# Patient Record
Sex: Female | Born: 1995 | Race: White | Hispanic: No | Marital: Single | Smoking: Never smoker
Health system: Southern US, Community
[De-identification: ages and names within clinical notes are randomized; demographics above are authoritative.]

---
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
## 2016-05-06 DIAGNOSIS — R05 Cough: Secondary | ICD-10-CM | POA: Diagnosis not present

## 2016-05-06 DIAGNOSIS — R0989 Other specified symptoms and signs involving the circulatory and respiratory systems: Secondary | ICD-10-CM | POA: Diagnosis not present

## 2016-05-06 DIAGNOSIS — J0100 Acute maxillary sinusitis, unspecified: Secondary | ICD-10-CM | POA: Diagnosis not present

## 2016-05-06 DIAGNOSIS — J208 Acute bronchitis due to other specified organisms: Secondary | ICD-10-CM | POA: Diagnosis not present

## 2016-06-28 DIAGNOSIS — H6693 Otitis media, unspecified, bilateral: Secondary | ICD-10-CM | POA: Diagnosis not present

## 2016-06-28 DIAGNOSIS — Z0001 Encounter for general adult medical examination with abnormal findings: Secondary | ICD-10-CM | POA: Diagnosis not present

## 2016-06-28 DIAGNOSIS — Z23 Encounter for immunization: Secondary | ICD-10-CM | POA: Diagnosis not present

## 2016-06-28 DIAGNOSIS — Z68.41 Body mass index (BMI) pediatric, less than 5th percentile for age: Secondary | ICD-10-CM | POA: Diagnosis not present

## 2016-06-28 DIAGNOSIS — Z0000 Encounter for general adult medical examination without abnormal findings: Secondary | ICD-10-CM | POA: Diagnosis not present

## 2016-06-28 DIAGNOSIS — Z1389 Encounter for screening for other disorder: Secondary | ICD-10-CM | POA: Diagnosis not present

## 2016-08-12 DIAGNOSIS — N390 Urinary tract infection, site not specified: Secondary | ICD-10-CM | POA: Diagnosis not present

## 2016-08-12 DIAGNOSIS — H6503 Acute serous otitis media, bilateral: Secondary | ICD-10-CM | POA: Diagnosis not present

## 2016-12-26 DIAGNOSIS — Z6820 Body mass index (BMI) 20.0-20.9, adult: Secondary | ICD-10-CM | POA: Diagnosis not present

## 2016-12-26 DIAGNOSIS — Z113 Encounter for screening for infections with a predominantly sexual mode of transmission: Secondary | ICD-10-CM | POA: Diagnosis not present

## 2016-12-26 DIAGNOSIS — Z1389 Encounter for screening for other disorder: Secondary | ICD-10-CM | POA: Diagnosis not present

## 2016-12-26 DIAGNOSIS — N390 Urinary tract infection, site not specified: Secondary | ICD-10-CM | POA: Diagnosis not present

## 2017-06-28 ENCOUNTER — Encounter: Payer: Self-pay | Admitting: Orthopaedic Surgery

## 2017-06-28 ENCOUNTER — Ambulatory Visit (INDEPENDENT_AMBULATORY_CARE_PROVIDER_SITE_OTHER): Payer: BLUE CROSS/BLUE SHIELD | Admitting: Orthopaedic Surgery

## 2017-06-28 ENCOUNTER — Ambulatory Visit (INDEPENDENT_AMBULATORY_CARE_PROVIDER_SITE_OTHER): Payer: BLUE CROSS/BLUE SHIELD

## 2017-06-28 ENCOUNTER — Ambulatory Visit: Payer: BLUE CROSS/BLUE SHIELD

## 2017-06-28 VITALS — BP 113/75 | HR 80 | Temp 97.9°F | Ht 65.0 in | Wt 123.0 lb

## 2017-06-28 DIAGNOSIS — M25561 Pain in right knee: Principal | ICD-10-CM

## 2017-06-28 DIAGNOSIS — M25562 Pain in left knee: Secondary | ICD-10-CM | POA: Diagnosis not present

## 2017-06-28 DIAGNOSIS — G8929 Other chronic pain: Secondary | ICD-10-CM

## 2017-06-28 NOTE — Progress Notes (Signed)
Subjective:    Patient ID: Alicia Tran, female    DOB: 06/23/1996, 22 y.o.   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  She has pain on and off.  She has no trauma.  She has no giving way of the knees, no redness, no swelling.  They just hurt at times.  She is not taking anything for it.  She has tried Advil, heat and ice at times.  She has instability.  She has periods of time with no pain and then has pain for a while.  She has no pattern for the pain.  She has no swelling of the legs or other joint pains.   Review of Systems  Musculoskeletal: Positive for arthralgias and gait problem.  All other systems reviewed and are negative.  History reviewed. No pertinent past medical history.  History reviewed. No pertinent surgical history.  No current outpatient medications on file prior to visit.   No current facility-administered medications on file prior to visit.     Social History   Socioeconomic History  . Marital status: Single    Spouse name: Not on file  . Number of children: Not on file  . Years of education: Not on file  . Highest education level: Not on file  Social Needs  . Financial resource strain: Not on file  . Food insecurity - worry: Not on file  . Food insecurity - inability: Not on file  . Transportation needs - medical: Not on file  . Transportation needs - non-medical: Not on file  Occupational History  . Not on file  Tobacco Use  . Smoking status: Never Smoker  . Smokeless tobacco: Never Used  Substance and Sexual Activity  . Alcohol use: No    Frequency: Never  . Drug use: No  . Sexual activity: Not on file  Other Topics Concern  . Not on file  Social History Narrative  . Not on file    Family History  Problem Relation Age of Onset  . Cancer Mother   . Healthy Father   . Thyroid disease Paternal Grandmother   . Cancer Paternal Grandmother     BP 113/75   Pulse 80   Temp 97.9 F (36.6 C)   Ht 5\' 5"  (1.651  m)   Wt 123 lb (55.8 kg)   BMI 20.47 kg/m      Objective:   Physical Exam  Constitutional: She is oriented to person, place, and time. She appears well-developed and well-nourished.  HENT:  Head: Normocephalic and atraumatic.  Eyes: Conjunctivae and EOM are normal. Pupils are equal, round, and reactive to light.  Neck: Normal range of motion. Neck supple.  Cardiovascular: Normal rate, regular rhythm and intact distal pulses.  Pulmonary/Chest: Effort normal.  Abdominal: Soft.  Musculoskeletal: She exhibits tenderness (Both knees have full ROM, no effusion, no crepitus, no redness, normal NV status.  Gait is normal.).  Neurological: She is alert and oriented to person, place, and time. She displays normal reflexes. No cranial nerve deficit. She exhibits normal muscle tone. Coordination normal.  Skin: Skin is warm and dry.  Psychiatric: She has a normal mood and affect. Her behavior is normal. Judgment and thought content normal.  Vitals reviewed.    X-rays of both knees were done, reported separately.       Assessment & Plan:   Encounter Diagnosis  Name Primary?  . Chronic pain of both knees Yes   X-rays are  negative.  I have recommended Aleve one bid pc for at least two weeks to see if any relief.  Use ice/heat as needed.  Continue dancing.  Return prn.  Electronically Signed Darreld Mclean, MD 1/3/20193:42 PM

## 2018-08-22 DIAGNOSIS — Z6821 Body mass index (BMI) 21.0-21.9, adult: Secondary | ICD-10-CM | POA: Diagnosis not present

## 2018-08-22 DIAGNOSIS — J028 Acute pharyngitis due to other specified organisms: Secondary | ICD-10-CM | POA: Diagnosis not present

## 2018-08-22 DIAGNOSIS — Z1389 Encounter for screening for other disorder: Secondary | ICD-10-CM | POA: Diagnosis not present

## 2018-12-09 ENCOUNTER — Ambulatory Visit (HOSPITAL_COMMUNITY)
Admission: EM | Admit: 2018-12-09 | Discharge: 2018-12-09 | Disposition: A | Discharge status: Home / Self Care | Payer: BC Managed Care – PPO | Attending: Family Medicine | Admitting: Family Medicine

## 2018-12-09 ENCOUNTER — Other Ambulatory Visit: Payer: Self-pay

## 2018-12-09 ENCOUNTER — Encounter (HOSPITAL_COMMUNITY): Payer: Self-pay | Admitting: Emergency Medicine

## 2018-12-09 ENCOUNTER — Ambulatory Visit (INDEPENDENT_AMBULATORY_CARE_PROVIDER_SITE_OTHER): Payer: BC Managed Care – PPO

## 2018-12-09 DIAGNOSIS — R0789 Other chest pain: Secondary | ICD-10-CM | POA: Diagnosis not present

## 2018-12-09 DIAGNOSIS — R05 Cough: Secondary | ICD-10-CM

## 2018-12-09 DIAGNOSIS — R059 Cough, unspecified: Secondary | ICD-10-CM

## 2018-12-09 DIAGNOSIS — R079 Chest pain, unspecified: Secondary | ICD-10-CM | POA: Diagnosis not present

## 2018-12-09 NOTE — ED Triage Notes (Signed)
Pt sts she thinks that she may have inhaled a pill accidentally 2 days ago and now feels some discomfort and possible congestion; no distress noted

## 2018-12-09 NOTE — ED Provider Notes (Signed)
[facility]    CSN: [id number] Arrival date & time: 12/09/18  1810     History   Chief Complaint Chief Complaint  Patient presents with  . congestion    HPI Alicia Tran is a 23 y.o. female presenting for acute concern of chest discomfort.  Patient states that 5 days ago she "might of inhaled a pill".  Patient endorsing foreign body sensation, throat discomfort, chest discomfort since.  Patient has dry, nonproductive cough without hemoptysis.  Patient states that a few days ago she felt like her voice was "crackly ".  Patient denies alleviating factors for her current symptoms.  Patient states that "my mom thinks that it is anxiety, but I just want to make sure my lungs are okay ".  Patient denies smoking, fever, myalgias, malaise, decreased appetite, choking since this incident 5 days ago dysphasia.  History reviewed. No pertinent past medical history.  There are no active problems to display for this patient.   History reviewed. No pertinent surgical history.  OB History   No obstetric history on file.      Home Medications    Prior to Admission medications   Not on File    Family History Family History  Problem Relation Age of Onset  . Cancer Mother   . Healthy Father   . Thyroid disease Paternal Grandmother   . Cancer Paternal Grandmother     Social History Social History   Tobacco Use  . Smoking status: Never Smoker  . Smokeless tobacco: Never Used  Substance Use Topics  . Alcohol use: No    Frequency: Never  . Drug use: No     Allergies   Patient has no known allergies.   Review of Systems As per HPI   Physical Exam Triage Vital Signs ED Triage Vitals  Enc Vitals Group     BP 12/09/18 1838 129/82     Pulse Rate 12/09/18 1838 95     Resp 12/09/18 1838 18     Temp 12/09/18 1838 98.8 F (37.1 C)     Temp Source 12/09/18 1838 Oral     SpO2 12/09/18 1838 100 %     Weight --      Height --      Head Circumference --    Peak Flow --      Pain Score 12/09/18 1839 0     Pain Loc --      Pain Edu? --      Excl. in [place]? --    No data found.  Updated Vital Signs BP 129/82 (BP Location: Right Arm)   Pulse 95   Temp 98.8 F (37.1 C) (Oral)   Resp 18   LMP 12/02/2018 (Exact Date)   SpO2 100%   Visual Acuity Right Eye Distance:   Left Eye Distance:   Bilateral Distance:    Right Eye Near:   Left Eye Near:    Bilateral Near:     Physical Exam Vitals signs reviewed.  Constitutional:      General: She is not in acute distress.    Appearance: She is normal weight.  HENT:     Head: Normocephalic and atraumatic.     Nose: Nose normal.     Mouth/Throat:     Mouth: Mucous membranes are moist.     Pharynx: Oropharynx is clear. No oropharyngeal exudate or posterior oropharyngeal erythema.     Comments: Uvula midline, rises symmetrically.  No foreign body identified. Eyes:  General: No scleral icterus.    Pupils: Pupils are equal, round, and reactive to light.  Neck:     Musculoskeletal: Normal range of motion and neck supple. No neck rigidity or muscular tenderness.     Comments: Trachea is midline Cardiovascular:     Rate and Rhythm: Normal rate.  Pulmonary:     Effort: Pulmonary effort is normal. No respiratory distress.     Breath sounds: No stridor. No wheezing, rhonchi or rales.  Chest:     Chest wall: No tenderness.  Lymphadenopathy:     Cervical: No cervical adenopathy.  Skin:    General: Skin is warm.     Capillary Refill: Capillary refill takes less than 2 seconds.     Coloration: Skin is not jaundiced or pale.  Neurological:     General: No focal deficit present.     Mental Status: She is alert and oriented to person, place, and time.  Psychiatric:        Judgment: Judgment normal.     Comments: Patient appears anxious at times, though consolable.      UC Treatments / Results  Labs (all labs ordered are listed, but only abnormal results are displayed) Labs Reviewed - No  data to display  EKG None  Radiology Dg Chest 2 View  Result Date: 12/09/2018 CLINICAL DATA:  Chest pain and left-sided neck pain. Concern for foreign body. EXAM: CHEST - 2 VIEW COMPARISON:  None. FINDINGS: The heart size and mediastinal contours are within normal limits. Both lungs are clear. The visualized skeletal structures are unremarkable. IMPRESSION: No active cardiopulmonary disease. Electronically Signed   By: Katherine Mantlehristopher  Green M.D.   On: 12/09/2018 19:48    Procedures Procedures (including critical care time)  Medications Ordered in UC Medications - No data to display  Initial Impression / Assessment and Plan / UC Course  I have reviewed the triage vital signs and the nursing notes.  Pertinent labs & imaging results that were available during my care of the patient were reviewed by me and considered in my medical decision making (see chart for details).     23 year old female resenting for acute concern of pill aspiration.  History and physical exam reassuring, chest x-ray done in office for patient reassurance/rule out aspiration pneumonia: Reviewed by radiologist- negative for foreign body, cardiopulmonary disease.  Reassurance was provided to patient's satisfaction.  Return precautions discussed, patient verbalized understanding. Final Clinical Impressions(s) / UC Diagnoses   Final diagnoses:  Cough     Discharge Instructions     Chest x-ray today did not show evidence of aspiration pneumonia. If you develop fever, worsening cough, shortness of breath, malaise, myalgias.    ED Prescriptions    None     Controlled Substance Prescriptions [place] Controlled Substance Registry consulted? Not Applicable   Alicia Tran, , Cordelia Poche-C 12/09/18 2135

## 2018-12-09 NOTE — Discharge Instructions (Signed)
Chest x-ray today did not show evidence of aspiration pneumonia. If you develop fever, worsening cough, shortness of breath, malaise, myalgias.

## 2018-12-12 DIAGNOSIS — F418 Other specified anxiety disorders: Secondary | ICD-10-CM | POA: Diagnosis not present

## 2018-12-12 DIAGNOSIS — Z23 Encounter for immunization: Secondary | ICD-10-CM | POA: Diagnosis not present

## 2018-12-12 DIAGNOSIS — Z6822 Body mass index (BMI) 22.0-22.9, adult: Secondary | ICD-10-CM | POA: Diagnosis not present

## 2018-12-12 DIAGNOSIS — Z1389 Encounter for screening for other disorder: Secondary | ICD-10-CM | POA: Diagnosis not present

## 2019-01-09 DIAGNOSIS — Z6821 Body mass index (BMI) 21.0-21.9, adult: Secondary | ICD-10-CM | POA: Diagnosis not present

## 2019-01-09 DIAGNOSIS — F418 Other specified anxiety disorders: Secondary | ICD-10-CM | POA: Diagnosis not present

## 2019-03-28 ENCOUNTER — Other Ambulatory Visit: Payer: Self-pay

## 2019-03-28 DIAGNOSIS — Z20822 Contact with and (suspected) exposure to covid-19: Secondary | ICD-10-CM

## 2019-03-29 LAB — NOVEL CORONAVIRUS, NAA: SARS-CoV-2, NAA: NOT DETECTED

## 2019-07-02 ENCOUNTER — Other Ambulatory Visit: Payer: Self-pay

## 2019-07-02 ENCOUNTER — Ambulatory Visit: Payer: Self-pay | Attending: Internal Medicine

## 2019-07-02 DIAGNOSIS — U071 COVID-19: Secondary | ICD-10-CM | POA: Insufficient documentation

## 2019-07-02 DIAGNOSIS — Z20822 Contact with and (suspected) exposure to covid-19: Secondary | ICD-10-CM

## 2019-07-03 LAB — NOVEL CORONAVIRUS, NAA: SARS-CoV-2, NAA: DETECTED — AB

## 2020-01-08 DIAGNOSIS — Z1389 Encounter for screening for other disorder: Secondary | ICD-10-CM | POA: Diagnosis not present

## 2020-01-08 DIAGNOSIS — F418 Other specified anxiety disorders: Secondary | ICD-10-CM | POA: Diagnosis not present

## 2020-01-08 DIAGNOSIS — Z6821 Body mass index (BMI) 21.0-21.9, adult: Secondary | ICD-10-CM | POA: Diagnosis not present

## 2020-08-26 DIAGNOSIS — Z1389 Encounter for screening for other disorder: Secondary | ICD-10-CM | POA: Diagnosis not present

## 2020-08-26 DIAGNOSIS — F419 Anxiety disorder, unspecified: Secondary | ICD-10-CM | POA: Diagnosis not present

## 2020-08-26 DIAGNOSIS — Z01411 Encounter for gynecological examination (general) (routine) with abnormal findings: Secondary | ICD-10-CM | POA: Diagnosis not present

## 2020-08-26 DIAGNOSIS — Z124 Encounter for screening for malignant neoplasm of cervix: Secondary | ICD-10-CM | POA: Diagnosis not present

## 2020-08-26 DIAGNOSIS — Z1331 Encounter for screening for depression: Secondary | ICD-10-CM | POA: Diagnosis not present

## 2020-08-26 DIAGNOSIS — Z6822 Body mass index (BMI) 22.0-22.9, adult: Secondary | ICD-10-CM | POA: Diagnosis not present

## 2020-08-26 DIAGNOSIS — Z118 Encounter for screening for other infectious and parasitic diseases: Secondary | ICD-10-CM | POA: Diagnosis not present

## 2020-11-27 IMAGING — DX CHEST - 2 VIEW
2 series · 2 of 2 positions shown · non-contrast
Comparison: None.

CLINICAL DATA: Chest pain and left-sided neck pain. Concern for
foreign body.

EXAM:
CHEST - 2 VIEW

[chest pa]
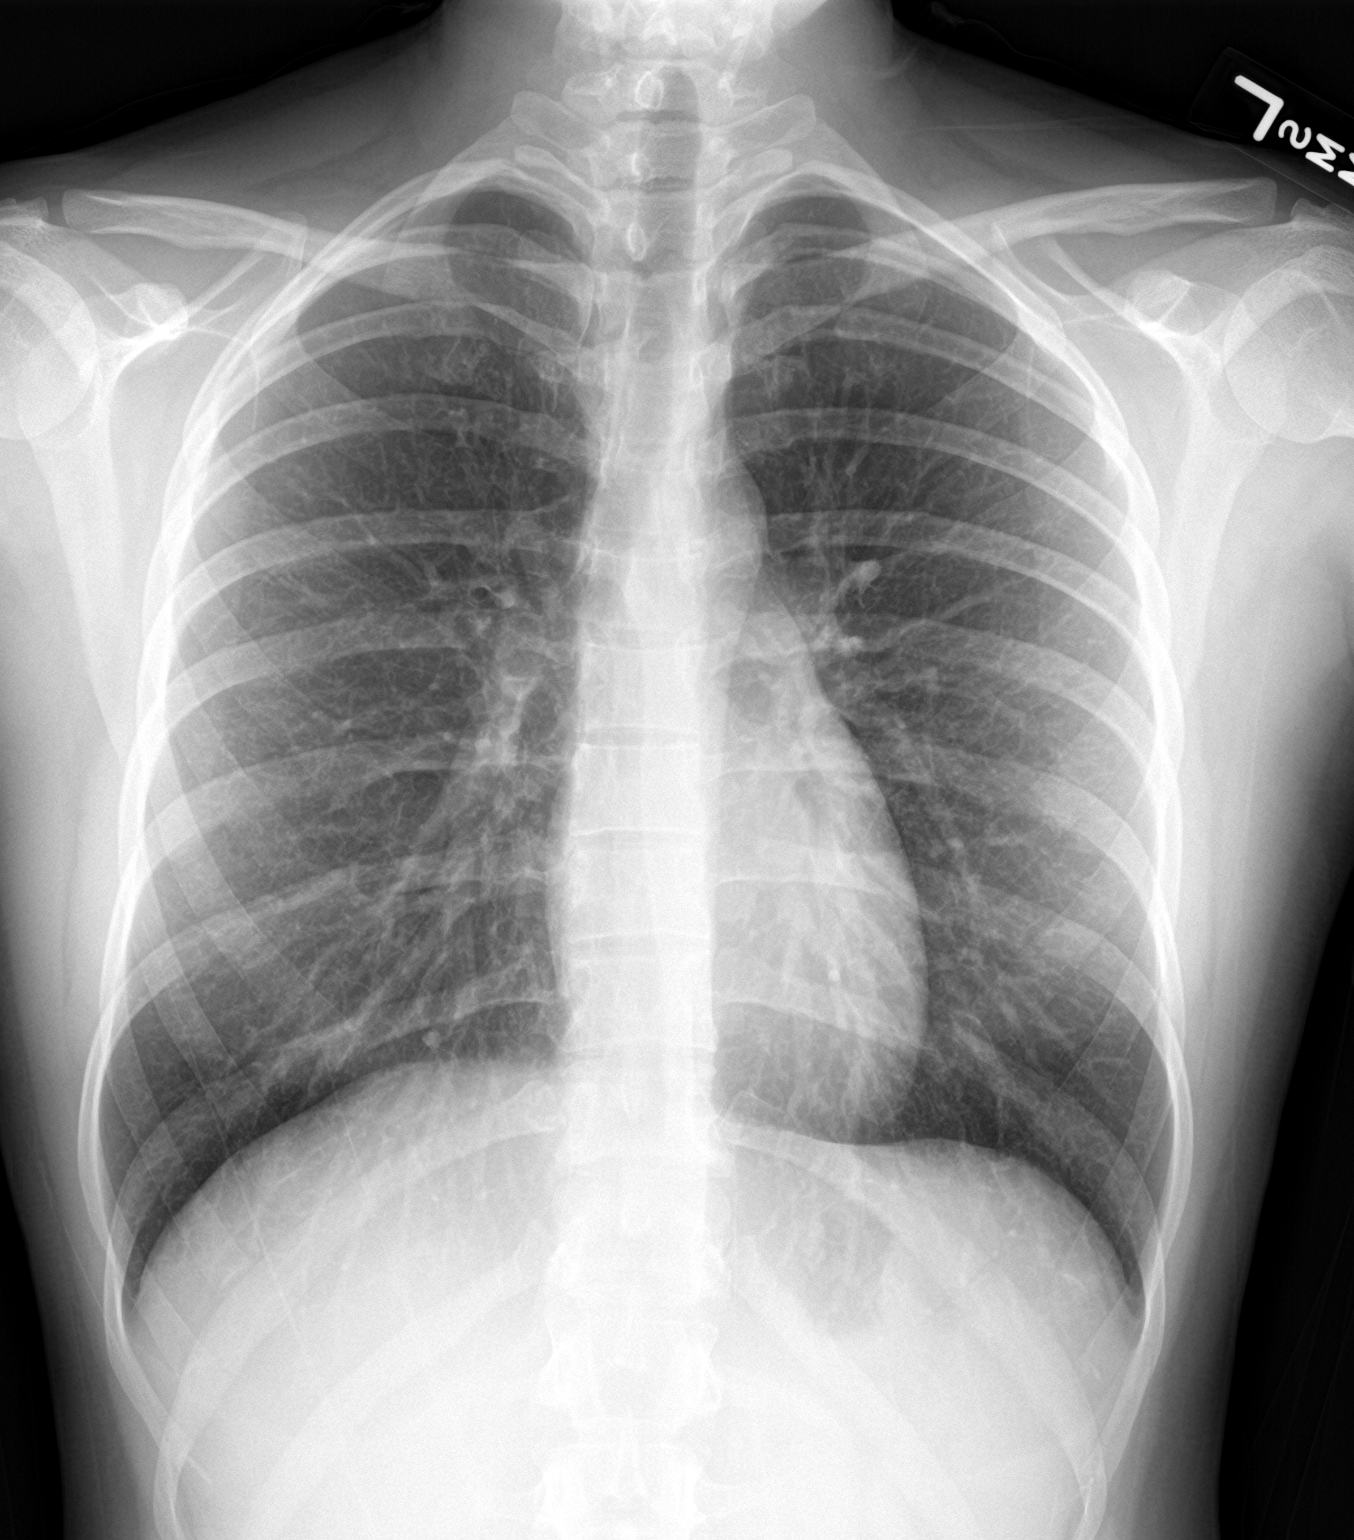

[chest lat]
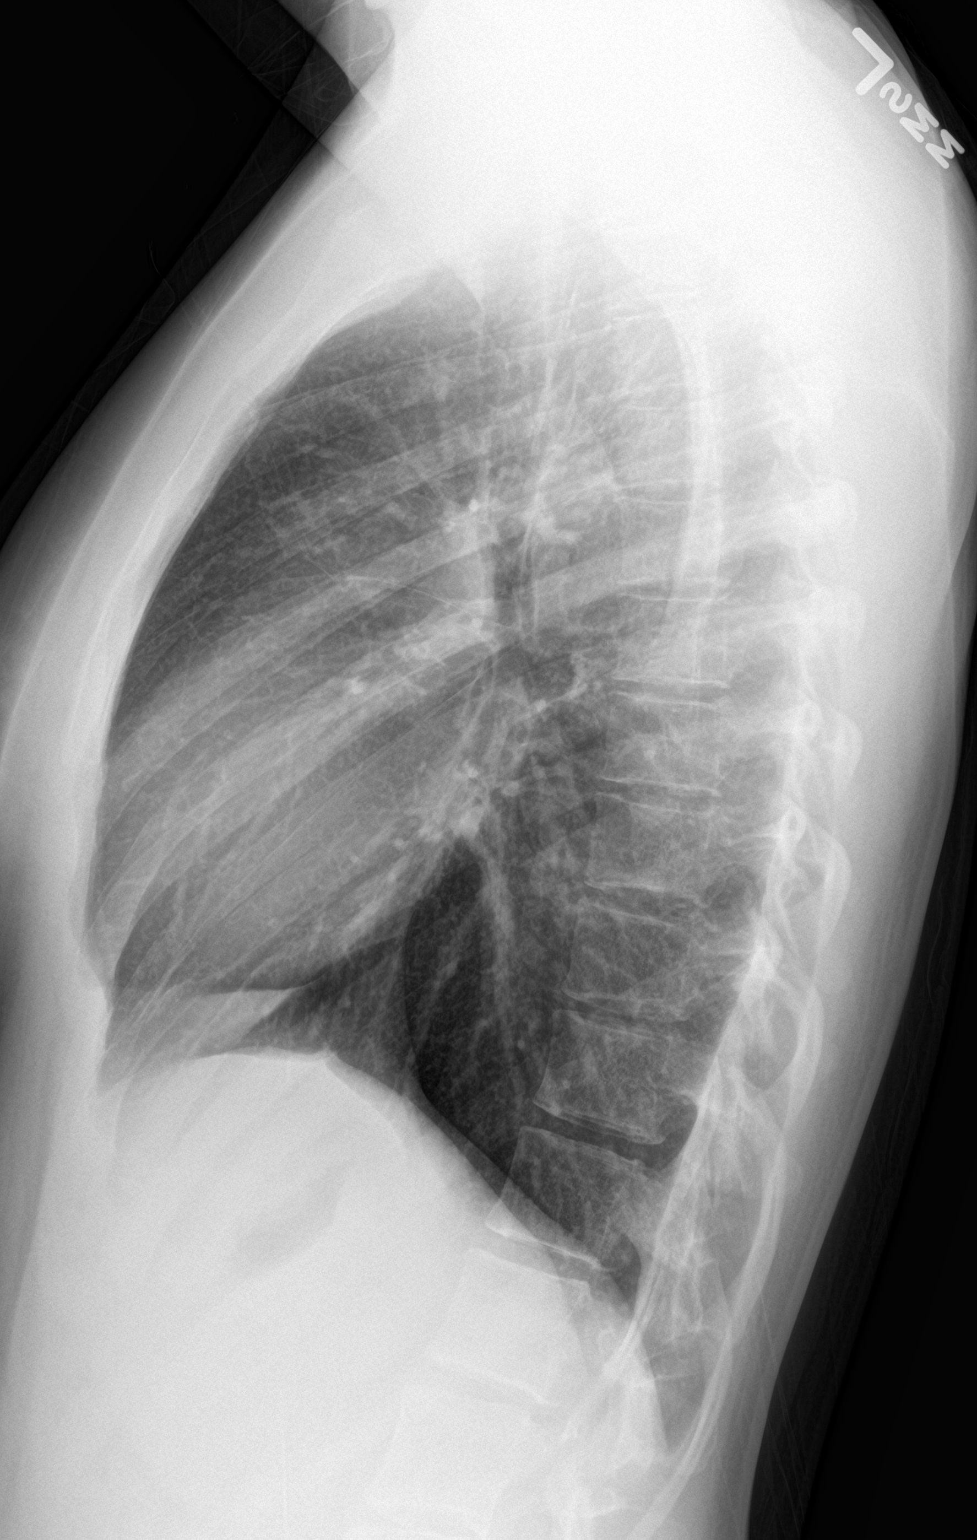

[2 of 2 positions shown; findings below may reference images not displayed]

FINDINGS: The heart size and mediastinal contours are within normal limits.
Both lungs are clear. The visualized skeletal structures are
unremarkable.
IMPRESSION: No active cardiopulmonary disease.

## 2021-01-06 DIAGNOSIS — F4323 Adjustment disorder with mixed anxiety and depressed mood: Secondary | ICD-10-CM | POA: Diagnosis not present

## 2021-02-01 DIAGNOSIS — F4323 Adjustment disorder with mixed anxiety and depressed mood: Secondary | ICD-10-CM | POA: Diagnosis not present

## 2021-03-30 DIAGNOSIS — F4323 Adjustment disorder with mixed anxiety and depressed mood: Secondary | ICD-10-CM | POA: Diagnosis not present

## 2021-04-29 DIAGNOSIS — F4323 Adjustment disorder with mixed anxiety and depressed mood: Secondary | ICD-10-CM | POA: Diagnosis not present

## 2022-11-28 ENCOUNTER — Ambulatory Visit
Admission: RE | Admit: 2022-11-28 | Discharge: 2022-11-28 | Disposition: A | Discharge status: Home / Self Care | Payer: 59 | Source: Ambulatory Visit

## 2022-11-28 VITALS — BP 117/72 | HR 85 | Temp 98.5°F | Resp 18

## 2022-11-28 DIAGNOSIS — H6993 Unspecified Eustachian tube disorder, bilateral: Secondary | ICD-10-CM

## 2022-11-28 DIAGNOSIS — H6593 Unspecified nonsuppurative otitis media, bilateral: Secondary | ICD-10-CM

## 2022-11-28 MED ORDER — FLUTICASONE PROPIONATE 50 MCG/ACT NA SUSP: 2.0000 | Freq: Every day | NASAL | 0 refills | Status: AC

## 2022-11-28 MED ORDER — PREDNISONE 20 MG PO TABS: 40.0000 mg | ORAL_TABLET | Freq: Every day | ORAL | 0 refills | Status: AC

## 2022-11-28 NOTE — ED Provider Notes (Signed)
[facility]    CSN: [id number] Arrival date & time: 11/28/22  1445      History   Chief Complaint Chief Complaint  Patient presents with   Ear Fullness    Head feels like it's underwater, ears feel like there's a lot of pressure behind them. Sometimes can't breathe through the left nostril. Taken zyrtec, flonase, and sudafed for about two weeks. - Entered by patient    HPI Alicia Tran is a 27 y.o. female.   The history is provided by the patient.   The patient presents for complaints of bilateral ear fullness.  Symptoms have been present for the past 2 weeks.  Patient states "I feel like I am underwater".  She also states that her hearing has been muffled.  Patient denies fever, chills, dizziness, ear drainage, headache, sore throat, cough, chest pain, abdominal pain, nausea, vomiting, or diarrhea.  Patient reports that she does have a history of seasonal allergies.  She states that she has been taking Sudafed and Zyrtec for her symptoms with minimal relief.  History reviewed. No pertinent past medical history.  There are no problems to display for this patient.   History reviewed. No pertinent surgical history.  OB History   No obstetric history on file.      Home Medications    Prior to Admission medications   Medication Sig Start Date End Date Taking? Authorizing Provider  citalopram (CELEXA) 10 MG tablet Take 10 mg by mouth daily.   Yes [provider]  fluticasone (FLONASE) 50 MCG/ACT nasal spray Place 2 sprays into both nostrils daily. 11/28/22  Yes Adra Shepler-Warren, Sadie Haber, NP  predniSONE (DELTASONE) 20 MG tablet Take 2 tablets (40 mg total) by mouth daily with breakfast for 5 days. 11/28/22 12/03/22 Yes Brandis Matsuura-Warren, Sadie Haber, NP    Family History Family History  Problem Relation Age of Onset   Cancer Mother    Healthy Father    Thyroid disease Paternal Grandmother    Cancer Paternal Grandmother     Social History Social History    Tobacco Use   Smoking status: Never   Smokeless tobacco: Never  Vaping Use   Vaping Use: Never used  Substance Use Topics   Alcohol use: No   Drug use: No     Allergies   Soy allergy   Review of Systems Review of Systems Per HPI  Physical Exam Triage Vital Signs ED Triage Vitals  Enc Vitals Group     BP 11/28/22 1449 117/72     Pulse Rate 11/28/22 1449 85     Resp 11/28/22 1449 18     Temp 11/28/22 1449 98.5 F (36.9 C)     Temp Source 11/28/22 1449 Oral     SpO2 11/28/22 1449 98 %     Weight --      Height --      Head Circumference --      Peak Flow --      Pain Score 11/28/22 1450 0     Pain Loc --      Pain Edu? --      Excl. in [place]? --    No data found.  Updated Vital Signs BP 117/72 (BP Location: Right Arm)   Pulse 85   Temp 98.5 F (36.9 C) (Oral)   Resp 18   LMP 11/14/2022 (Approximate)   SpO2 98%   Visual Acuity Right Eye Distance:   Left Eye Distance:   Bilateral Distance:  Right Eye Near:   Left Eye Near:    Bilateral Near:     Physical Exam Vitals and nursing note reviewed.  Constitutional:      General: She is not in acute distress.    Appearance: Normal appearance.  HENT:     Head: Normocephalic.     Right Ear: Ear canal and external ear normal. No decreased hearing noted. A middle ear effusion is present.     Left Ear: Ear canal and external ear normal. Decreased hearing (muffled hearing) noted. A middle ear effusion is present.     Nose: Nose normal.     Right Turbinates: Enlarged and swollen.     Left Turbinates: Enlarged and swollen.     Right Sinus: No maxillary sinus tenderness or frontal sinus tenderness.     Left Sinus: No maxillary sinus tenderness or frontal sinus tenderness.     Mouth/Throat:     Lips: Pink.     Mouth: Mucous membranes are moist.     Pharynx: Oropharynx is clear. Uvula midline. Posterior oropharyngeal erythema present. No pharyngeal swelling.     Comments: Cobblestoning present to posterior  oropharynx Eyes:     Extraocular Movements: Extraocular movements intact.     Conjunctiva/sclera: Conjunctivae normal.     Pupils: Pupils are equal, round, and reactive to light.  Cardiovascular:     Rate and Rhythm: Normal rate and regular rhythm.     Pulses: Normal pulses.     Heart sounds: Normal heart sounds.  Pulmonary:     Effort: Pulmonary effort is normal. No respiratory distress.     Breath sounds: Normal breath sounds. No stridor. No wheezing, rhonchi or rales.  Abdominal:     General: Bowel sounds are normal.     Palpations: Abdomen is soft.     Tenderness: There is no abdominal tenderness.  Musculoskeletal:     Cervical back: Normal range of motion.  Lymphadenopathy:     Cervical: No cervical adenopathy.  Skin:    General: Skin is warm and dry.  Neurological:     General: No focal deficit present.     Mental Status: She is alert and oriented to person, place, and time.  Psychiatric:        Mood and Affect: Mood normal.        Behavior: Behavior normal.      UC Treatments / Results  Labs (all labs ordered are listed, but only abnormal results are displayed) Labs Reviewed - No data to display  EKG   Radiology No results found.  Procedures Procedures (including critical care time)  Medications Ordered in UC Medications - No data to display  Initial Impression / Assessment and Plan / UC Course  I have reviewed the triage vital signs and the nursing notes.  Pertinent labs & imaging results that were available during my care of the patient were reviewed by me and considered in my medical decision making (see chart for details).  The patient is well-appearing, she is in no acute distress, vital signs are stable.  Symptoms consistent with bilateral eustachian tube dysfunction/middle ear effusion.  Will treat with prednisone 40 mg for 5 days, along with fluticasone 50 mcg nasal spray.  Patient advised to continue Zyrtec and Sudafed as needed.  Supportive  care recommendations were provided and discussed with the patient to include over-the-counter analgesics for pain or discomfort, warm compresses to the ears, and avoidance of water entrance inside of the ears.  Patient was advised that if  symptoms do not improve recommend following up with her primary care physician or with ENT.  Patient is in agreement with this plan of care and verbalized understanding.  All questions were answered.  Patient states discharge.   Final Clinical Impressions(s) / UC Diagnoses   Final diagnoses:  Eustachian tube dysfunction, bilateral  Middle ear effusion, bilateral     Discharge Instructions      Take medications as prescribed.  Continue Zyrtec daily, may use Sudafed as needed. May take over-the-counter Tylenol or ibuprofen as needed for pain, fever, general discomfort. Warm compresses to the ears to help with pain or discomfort. Avoid getting water inside of the ear while symptoms persist. As discussed, if symptoms do not improve with this treatment, recommend following up with your primary care physician or with ENT for further evaluation. Follow-up as needed.      ED Prescriptions     Medication Sig Dispense Auth. Provider   predniSONE (DELTASONE) 20 MG tablet Take 2 tablets (40 mg total) by mouth daily with breakfast for 5 days. 10 tablet Joaovictor Krone-Warren, Sadie Haber, NP   fluticasone (FLONASE) 50 MCG/ACT nasal spray Place 2 sprays into both nostrils daily. 16 g Kjirsten Bloodgood-Warren, Sadie Haber, NP      PDMP not reviewed this encounter.   Abran Cantor, NP 11/28/22 1526

## 2022-11-28 NOTE — Discharge Instructions (Signed)
Take medications as prescribed.  Continue Zyrtec daily, may use Sudafed as needed. May take over-the-counter Tylenol or ibuprofen as needed for pain, fever, general discomfort. Warm compresses to the ears to help with pain or discomfort. Avoid getting water inside of the ear while symptoms persist. As discussed, if symptoms do not improve with this treatment, recommend following up with your primary care physician or with ENT for further evaluation. Follow-up as needed.

## 2022-11-28 NOTE — ED Triage Notes (Signed)
Left ear fullness x 2 weeks. States she feels like she is under water.  Has been taking sudafed and zyrtec without relief.

## 2023-06-15 ENCOUNTER — Ambulatory Visit
Admission: EM | Admit: 2023-06-15 | Discharge: 2023-06-15 | Disposition: A | Discharge status: Home / Self Care | Payer: 59 | Attending: Nurse Practitioner | Admitting: Nurse Practitioner

## 2023-06-15 DIAGNOSIS — J029 Acute pharyngitis, unspecified: Secondary | ICD-10-CM | POA: Diagnosis present

## 2023-06-15 LAB — POCT RAPID STREP A (OFFICE): Rapid Strep A Screen: NEGATIVE

## 2023-06-15 NOTE — ED Triage Notes (Signed)
Pt states sore throat since last night.  

## 2023-06-15 NOTE — Discharge Instructions (Addendum)
The rapid strep test was negative, a throat culture is pending.  You will be contacted if the pending test result is abnormal.  You also have access to the results via MyChart. Recommend over-the-counter Tylenol or ibuprofen as needed for pain, fever, or general discomfort. Warm salt water gargles 3-4 times daily as needed for throat pain or discomfort. Recommend Chloraseptic throat spray or throat lozenges while symptoms persist. As discussed, if symptoms do not improve, or you you develop worsening, you may follow-up in this clinic or with your PCP for further evaluation. Follow-up as needed.

## 2023-06-15 NOTE — ED Provider Notes (Signed)
[facility]    CSN: [id number] Arrival date & time: 06/15/23  1128      History   Chief Complaint Chief Complaint  Patient presents with   Sore Throat    HPI Alicia Tran is a 27 y.o. female.   The history is provided by the patient.   Patient presents for complaints of sore throat.  Reports that she has also had a runny nose.  Patient denies fever, chills, headache, ear pain, nasal congestion, cough, abdominal pain, nausea, vomiting, diarrhea, or rash.  Denies any obvious known sick contacts.  Reports she has not taken any medication for her symptoms. History reviewed. No pertinent past medical history.  There are no active problems to display for this patient.   History reviewed. No pertinent surgical history.  OB History   No obstetric history on file.      Home Medications    Prior to Admission medications   Medication Sig Start Date End Date Taking? Authorizing Provider  Norgestimate-Eth Estradiol (TRI-LO-SPRINTEC) 0.18/0.215/0.25 MG-25 MCG TABS  11/09/21  Yes [provider]  citalopram (CELEXA) 10 MG tablet Take 10 mg by mouth daily.    [provider]  fluticasone (FLONASE) 50 MCG/ACT nasal spray Place 2 sprays into both nostrils daily. 11/28/22   Leath-Warren, Sadie Haber, NP    Family History Family History  Problem Relation Age of Onset   Cancer Mother    Healthy Father    Thyroid disease Paternal Grandmother    Cancer Paternal Grandmother     Social History Social History   Tobacco Use   Smoking status: Never   Smokeless tobacco: Never  Vaping Use   Vaping status: Never Used  Substance Use Topics   Alcohol use: No   Drug use: No     Allergies   Soy allergy (do not select)   Review of Systems Review of Systems Per HPI  Physical Exam Triage Vital Signs ED Triage Vitals  Encounter Vitals Group     BP 06/15/23 1141 119/77     Systolic BP Percentile --      Diastolic BP Percentile --      Pulse  Rate 06/15/23 1141 95     Resp 06/15/23 1141 16     Temp 06/15/23 1141 99.1 F (37.3 C)     Temp Source 06/15/23 1141 Oral     SpO2 06/15/23 1141 96 %     Weight --      Height --      Head Circumference --      Peak Flow --      Pain Score 06/15/23 1143 7     Pain Loc --      Pain Education --      Exclude from Growth Chart --    No data found.  Updated Vital Signs BP 119/77 (BP Location: Left Arm)   Pulse 95   Temp 99.1 F (37.3 C) (Oral)   Resp 16   LMP 06/08/2023 (Approximate)   SpO2 96%   Visual Acuity Right Eye Distance:   Left Eye Distance:   Bilateral Distance:    Right Eye Near:   Left Eye Near:    Bilateral Near:     Physical Exam Vitals and nursing note reviewed.  Constitutional:      General: She is not in acute distress.    Appearance: She is well-developed.  HENT:     Head: Normocephalic.     Right Ear: Tympanic membrane and  ear canal normal.     Left Ear: Tympanic membrane and ear canal normal.     Nose: Nose normal. No congestion.     Right Turbinates: Enlarged and swollen.     Left Turbinates: Enlarged and swollen.     Mouth/Throat:     Mouth: Mucous membranes are moist.     Pharynx: Uvula midline. Posterior oropharyngeal erythema and postnasal drip present. No pharyngeal swelling or uvula swelling.     Tonsils: No tonsillar exudate. 1+ on the right. 1+ on the left.     Comments: Cobblestoning present to posterior oropharynx  Eyes:     Conjunctiva/sclera: Conjunctivae normal.     Pupils: Pupils are equal, round, and reactive to light.  Cardiovascular:     Rate and Rhythm: Normal rate and regular rhythm.     Pulses: Normal pulses.     Heart sounds: Normal heart sounds.  Pulmonary:     Effort: Pulmonary effort is normal. No respiratory distress.     Breath sounds: Normal breath sounds. No stridor. No wheezing, rhonchi or rales.  Abdominal:     General: Bowel sounds are normal.     Palpations: Abdomen is soft.     Tenderness: There is no  abdominal tenderness.  Musculoskeletal:     Cervical back: Normal range of motion.  Lymphadenopathy:     Cervical: No cervical adenopathy.  Skin:    General: Skin is warm and dry.  Neurological:     General: No focal deficit present.     Mental Status: She is alert and oriented to person, place, and time.  Psychiatric:        Mood and Affect: Mood normal.        Behavior: Behavior normal.      UC Treatments / Results  Labs (all labs ordered are listed, but only abnormal results are displayed) Labs Reviewed  CULTURE, GROUP A STREP [facility])  POCT RAPID STREP A (OFFICE)    EKG   Radiology No results found.  Procedures Procedures (including critical care time)  Medications Ordered in UC Medications - No data to display  Initial Impression / Assessment and Plan / UC Course  I have reviewed the triage vital signs and the nursing notes.  Pertinent labs & imaging results that were available during my care of the patient were reviewed by me and considered in my medical decision making (see chart for details).  Rapid strep test was negative, throat culture is pending.  Supportive care recommendations were provided and discussed with the patient to include over-the-counter analgesics, warm salt water gargles, and a soft diet.  Patient was advised regarding when follow-up will be necessary.  Patient was in agreement with this plan of care and verbalized understanding.  All questions were answered.  Patient stable for discharge.  Work note was provided.  Final Clinical Impressions(s) / UC Diagnoses   Final diagnoses:  Acute pharyngitis, unspecified etiology     Discharge Instructions      The rapid strep test was negative, a throat culture is pending.  You will be contacted if the pending test result is abnormal.  You also have access to the results via MyChart. Recommend over-the-counter Tylenol or ibuprofen as needed for pain, fever, or general discomfort. Warm salt water  gargles 3-4 times daily as needed for throat pain or discomfort. Recommend Chloraseptic throat spray or throat lozenges while symptoms persist. As discussed, if symptoms do not improve, or you you develop worsening, you may follow-up in this  clinic or with your PCP for further evaluation. Follow-up as needed.     ED Prescriptions   None    PDMP not reviewed this encounter.   Abran Cantor, NP 06/15/23 1226

## 2023-06-18 LAB — CULTURE, GROUP A STREP (THRC)
